# Patient Record
Sex: Male | Born: 1980 | Hispanic: Yes | Marital: Married | Smoking: Never smoker
Health system: Southern US, Community
[De-identification: ages and names within clinical notes are randomized; demographics above are authoritative.]

## PROBLEM LIST (undated history)

## (undated) DIAGNOSIS — S61012S Laceration without foreign body of left thumb without damage to nail, sequela: Secondary | ICD-10-CM

---

## 2013-07-25 ENCOUNTER — Emergency Department (INDEPENDENT_AMBULATORY_CARE_PROVIDER_SITE_OTHER): Payer: BC Managed Care – PPO

## 2013-07-25 ENCOUNTER — Emergency Department (HOSPITAL_COMMUNITY)
Admission: EM | Admit: 2013-07-25 | Discharge: 2013-07-25 | Disposition: A | Discharge status: Home / Self Care | Payer: BC Managed Care – PPO | Source: Home / Self Care | Attending: Family Medicine | Admitting: Family Medicine

## 2013-07-25 ENCOUNTER — Encounter (HOSPITAL_COMMUNITY): Payer: Self-pay | Admitting: Emergency Medicine

## 2013-07-25 DIAGNOSIS — B9789 Other viral agents as the cause of diseases classified elsewhere: Secondary | ICD-10-CM

## 2013-07-25 DIAGNOSIS — B349 Viral infection, unspecified: Secondary | ICD-10-CM

## 2013-07-25 LAB — POCT RAPID STREP A: Streptococcus, Group A Screen (Direct): NEGATIVE

## 2013-07-25 NOTE — Discharge Instructions (Signed)
Rest, drink plenty of fluids, return on tues if further problems.

## 2013-07-25 NOTE — ED Notes (Signed)
Reportedly has been having URI for several days, w congestion, generalized pain, chills

## 2013-07-25 NOTE — ED Provider Notes (Signed)
CSN: [id number]     Arrival date & time 07/25/13  1212 History   First MD Initiated Contact with Patient 07/25/13 1356     Chief Complaint  Patient presents with  . URI   (Consider location/radiation/quality/duration/timing/severity/associated sxs/prior Treatment) Patient is a 33 y.o. male presenting with URI. The history is provided by the patient. The history is limited by a language barrier.  URI Presenting symptoms: congestion, cough, fever and sore throat   Severity:  Moderate Onset quality:  Sudden Duration:  3 days Progression:  Worsening Chronicity:  New Associated symptoms: myalgias   Associated symptoms comment:  No gi or gu sx, no rash or ticks. Risk factors: no recent illness, no recent travel and no sick contacts     History reviewed. No pertinent past medical history. History reviewed. No pertinent past surgical history. History reviewed. No pertinent family history. History  Substance Use Topics  . Smoking status: Never Smoker   . Smokeless tobacco: Not on file  . Alcohol Use: No    Review of Systems  Constitutional: Positive for fever and chills.  HENT: Positive for congestion and sore throat.   Respiratory: Positive for cough.   Gastrointestinal: Negative.   Genitourinary: Negative.   Musculoskeletal: Positive for myalgias.  Skin: Negative for rash.    Allergies  Review of patient's allergies indicates no known allergies.  Home Medications  No current outpatient prescriptions on file. BP 134/72  Pulse 98  Temp(Src) 100.6 F (38.1 C) (Oral)  Resp 16  SpO2 100% Physical Exam  Nursing note and vitals reviewed. Constitutional: He is oriented to person, place, and time. He appears well-developed and well-nourished. No distress.  HENT:  Head: Normocephalic.  Right Ear: External ear normal.  Left Ear: External ear normal.  Mouth/Throat: Oropharynx is clear and moist.  Eyes: Conjunctivae are normal. Pupils are equal, round, and reactive to light.   Neck: Normal range of motion. Neck supple.  Cardiovascular: Regular rhythm, normal heart sounds and intact distal pulses.   Pulmonary/Chest: Effort normal and breath sounds normal.  Abdominal: Soft.  Lymphadenopathy:    He has no cervical adenopathy.  Neurological: He is alert and oriented to person, place, and time.  Skin: Skin is warm and dry.    ED Course  Procedures (including critical care time) Labs Review Labs Reviewed  POCT RAPID STREP A ([facility] CARE ONLY)   Imaging Review Dg Chest 2 View  07/25/2013   CLINICAL DATA:  Cough an fever  EXAM: CHEST  2 VIEW  COMPARISON:  None.  FINDINGS: There is no edema or consolidation. The heart size and pulmonary vascularity are normal. No adenopathy. No bone lesions.  IMPRESSION: No abnormality noted.   Electronically Signed   By: Bretta BangWilliam  Woodruff M.D.   On: 07/25/2013 14:41   Strep neg X-rays reviewed and report per radiologist.   MDM   1. Viral syndrome        Linna HoffJames D Kimbree Casanas, MD 07/25/13 [phone]

## 2013-07-27 LAB — CULTURE, GROUP A STREP

## 2017-11-23 ENCOUNTER — Ambulatory Visit: Payer: Self-pay

## 2017-11-23 ENCOUNTER — Other Ambulatory Visit: Payer: Self-pay | Admitting: Occupational Medicine

## 2017-11-23 DIAGNOSIS — M79644 Pain in right finger(s): Secondary | ICD-10-CM

## 2020-12-01 ENCOUNTER — Encounter (HOSPITAL_COMMUNITY): Payer: Self-pay | Admitting: Emergency Medicine

## 2020-12-01 ENCOUNTER — Emergency Department (HOSPITAL_COMMUNITY): Payer: No Typology Code available for payment source

## 2020-12-01 ENCOUNTER — Emergency Department (HOSPITAL_COMMUNITY)
Admission: EM | Admit: 2020-12-01 | Discharge: 2020-12-01 | Disposition: A | Discharge status: Home / Self Care | Payer: No Typology Code available for payment source | Attending: Emergency Medicine | Admitting: Emergency Medicine

## 2020-12-01 DIAGNOSIS — W298XXA Contact with other powered powered hand tools and household machinery, initial encounter: Secondary | ICD-10-CM | POA: Diagnosis not present

## 2020-12-01 DIAGNOSIS — S61012A Laceration without foreign body of left thumb without damage to nail, initial encounter: Secondary | ICD-10-CM | POA: Diagnosis present

## 2020-12-01 DIAGNOSIS — Z23 Encounter for immunization: Secondary | ICD-10-CM | POA: Diagnosis not present

## 2020-12-01 DIAGNOSIS — Y939 Activity, unspecified: Secondary | ICD-10-CM | POA: Diagnosis not present

## 2020-12-01 MED ORDER — OXYCODONE-ACETAMINOPHEN 5-325 MG PO TABS
1.0000 | ORAL_TABLET | Freq: Once | ORAL | Status: AC
  Administered 2020-12-01: 1 via ORAL
  Filled 2020-12-01: qty 1

## 2020-12-01 MED ORDER — CEPHALEXIN 500 MG PO CAPS: 500.0000 mg | ORAL_CAPSULE | Freq: Two times a day (BID) | ORAL | 0 refills | Status: AC

## 2020-12-01 MED ORDER — OXYCODONE HCL 5 MG PO TABS
5.0000 mg | ORAL_TABLET | Freq: Once | ORAL | Status: AC
Start: 2020-12-01 — End: 2020-12-01
  Administered 2020-12-01: 5 mg via ORAL
  Filled 2020-12-01: qty 1

## 2020-12-01 MED ORDER — ACETAMINOPHEN 325 MG PO TABS
650.0000 mg | ORAL_TABLET | Freq: Once | ORAL | Status: AC
  Administered 2020-12-01: 650 mg via ORAL
  Filled 2020-12-01: qty 2

## 2020-12-01 MED ORDER — OXYCODONE-ACETAMINOPHEN 5-325 MG PO TABS: 1.0000 | ORAL_TABLET | Freq: Three times a day (TID) | ORAL | 0 refills | Status: AC | PRN

## 2020-12-01 MED ORDER — LIDOCAINE HCL (PF) 1 % IJ SOLN
30.0000 mL | Freq: Once | INTRAMUSCULAR | Status: AC
  Administered 2020-12-01: 30 mL
  Filled 2020-12-01: qty 30

## 2020-12-01 MED ORDER — TETANUS-DIPHTH-ACELL PERTUSSIS 5-2.5-18.5 LF-MCG/0.5 IM SUSY
0.5000 mL | PREFILLED_SYRINGE | Freq: Once | INTRAMUSCULAR | Status: AC
  Administered 2020-12-01: 0.5 mL via INTRAMUSCULAR
  Filled 2020-12-01: qty 0.5

## 2020-12-01 MED ORDER — CEFAZOLIN SODIUM-DEXTROSE 2-4 GM/100ML-% IV SOLN
2.0000 g | Freq: Once | INTRAVENOUS | Status: AC
  Administered 2020-12-01: 2 g via INTRAVENOUS
  Filled 2020-12-01: qty 100

## 2020-12-01 MED ORDER — BACITRACIN ZINC 500 UNIT/GM EX OINT
TOPICAL_OINTMENT | Freq: Two times a day (BID) | CUTANEOUS | Status: DC
  Administered 2020-12-01: 1 via TOPICAL
  Filled 2020-12-01: qty 0.9

## 2020-12-01 NOTE — ED Provider Notes (Addendum)
[facility] EMERGENCY DEPARTMENT Provider Note   CSN: [id number] Arrival date & time: 12/01/20  8127     History No chief complaint on file.   Mitchell Young is a 40 y.o. male presenting to the ED with a chief complaint of laceration.  Just prior to arrival was using a drill when he sustained a laceration to his left thumb.  Reports pain with injury.  Denies any numbness or weakness.  Unsure of last tetanus.  Denies any other injuries.  HPI  Medical Spanish interpreter was used for visit.  History reviewed. No pertinent past medical history.  There are no problems to display for this patient.   History reviewed. No pertinent surgical history.     No family history on file.  Social History   Tobacco Use   Smoking status: Never   Smokeless tobacco: Never  Substance Use Topics   Alcohol use: No   Drug use: Not Currently    Home Medications Prior to Admission medications   Medication Sig Start Date End Date Taking? Authorizing Provider  cephALEXin (KEFLEX) 500 MG capsule Take 1 capsule (500 mg total) by mouth 2 (two) times daily for 7 days. 12/01/20 12/08/20 Yes Talajah Slimp, PA-C  oxyCODONE-acetaminophen (PERCOCET/ROXICET) 5-325 MG tablet Take 1 tablet by mouth every 8 (eight) hours as needed for severe pain. 12/01/20  Yes Brittany Osier, PA-C    Allergies    Patient has no known allergies.  Review of Systems   Review of Systems  Constitutional:  Negative for appetite change, chills and fever.  HENT:  Negative for ear pain, rhinorrhea, sneezing and sore throat.   Eyes:  Negative for photophobia and visual disturbance.  Respiratory:  Negative for cough, chest tightness, shortness of breath and wheezing.   Cardiovascular:  Negative for chest pain and palpitations.  Gastrointestinal:  Negative for abdominal pain, blood in stool, constipation, diarrhea, nausea and vomiting.  Genitourinary:  Negative for dysuria, hematuria and urgency.   Musculoskeletal:  Negative for myalgias.  Skin:  Positive for wound. Negative for rash.  Neurological:  Negative for dizziness, weakness and light-headedness.   Physical Exam Updated Vital Signs BP 125/79   Pulse (!) 50   Temp 98.3 F (36.8 C) (Oral)   Resp 19   SpO2 100%   Physical Exam Vitals and nursing note reviewed.  Constitutional:      General: He is not in acute distress.    Appearance: He is well-developed.  HENT:     Head: Normocephalic and atraumatic.     Nose: Nose normal.  Eyes:     General: No scleral icterus.       Left eye: No discharge.     Conjunctiva/sclera: Conjunctivae normal.  Cardiovascular:     Rate and Rhythm: Normal rate and regular rhythm.     Heart sounds: Normal heart sounds. No murmur heard.   No friction rub. No gallop.  Pulmonary:     Effort: Pulmonary effort is normal. No respiratory distress.     Breath sounds: Normal breath sounds.  Abdominal:     General: Bowel sounds are normal. There is no distension.     Palpations: Abdomen is soft.     Tenderness: There is no abdominal tenderness. There is no guarding.  Musculoskeletal:        General: Normal range of motion.     Cervical back: Normal range of motion and neck supple.  Skin:    General: Skin is warm and dry.  Findings: No rash.     Comments: 5 cm laceration noted to left thumb in the image.  Normal sensation to light touch.  2+ radial pulse palpated.  Normal range of motion of the DIP joint.  Limited extension at the PIP joint.  Possible visible tendon on exam.  Neurological:     Mental Status: He is alert.     Motor: No abnormal muscle tone.     Coordination: Coordination normal.      ED Results / Procedures / Treatments   Labs (all labs ordered are listed, but only abnormal results are displayed) Labs Reviewed - No data to display  EKG None  Radiology DG Hand Complete Left  Result Date: 12/01/2020 CLINICAL DATA:  40 year old male status post laceration from  power drill. EXAM: LEFT HAND - COMPLETE 3+ VIEW COMPARISON:  None. FINDINGS: There are numerous dystrophic calcifications about the left hand, some appear to be at the skin surface. Soft tissue wound overlying the left thumb MCP joint. No underlying fracture identified. Joint spaces and alignment maintained. Other visible osseous structures appear intact. IMPRESSION: 1. Soft tissue wound at the left thumb metacarpal with no underlying osseous abnormality identified. 2. Numerous dystrophic calcifications about the left hand, some appear to be on the skin surface. Electronically Signed   By: Odessa Fleming M.D.   On: 12/01/2020 11:21    Procedures .Marland KitchenLaceration Repair  Date/Time: 12/01/2020 2:26 PM Performed by: Dietrich Pates, PA-C Authorized by: Dietrich Pates, PA-C   Consent:    Consent obtained:  Verbal   Consent given by:  Patient   Risks, benefits, and alternatives were discussed: yes     Risks discussed:  Infection, need for additional repair, pain, poor cosmetic result, poor wound healing, tendon damage, vascular damage, retained foreign body and nerve damage Universal protocol:    Patient identity confirmed:  Verbally with patient Anesthesia:    Anesthesia method:  Local infiltration   Local anesthetic:  Lidocaine 1% w/o epi Laceration details:    Location:  Finger   Finger location:  L thumb   Length (cm):  5 Exploration:    Hemostasis achieved with:  Direct pressure   Wound exploration: wound explored through full range of motion and entire depth of wound visualized     Wound extent: no underlying fracture noted     Contaminated: yes   Treatment:    Area cleansed with:  Saline   Amount of cleaning:  Extensive   Irrigation solution:  Sterile saline   Irrigation volume:  2L   Irrigation method:  Pressure wash, syringe and tap Skin repair:    Repair method:  Sutures   Suture size:  4-0   Wound skin closure material used: VICRYL RAPIDE.   Suture technique:  Simple interrupted   Number  of sutures:  14 Repair type:    Repair type:  Simple Post-procedure details:    Dressing:  Open (no dressing)   Procedure completion:  Tolerated well, no immediate complications   Medications Ordered in ED Medications  oxyCODONE (Oxy IR/ROXICODONE) immediate release tablet 5 mg (has no administration in time range)  bacitracin ointment (has no administration in time range)  lidocaine (PF) (XYLOCAINE) 1 % injection 30 mL (30 mLs Infiltration Given 12/01/20 1237)  Tdap (BOOSTRIX) injection 0.5 mL (0.5 mLs Intramuscular Given 12/01/20 1237)  oxyCODONE-acetaminophen (PERCOCET/ROXICET) 5-325 MG per tablet 1 tablet (1 tablet Oral Given 12/01/20 1236)  acetaminophen (TYLENOL) tablet 650 mg (650 mg Oral Given 12/01/20 1236)  ceFAZolin (ANCEF)  IVPB 2g/100 mL premix (0 g Intravenous Stopped 12/01/20 1329)    ED Course  I have reviewed the triage vital signs and the nursing notes.  Pertinent labs & imaging results that were available during my care of the patient were reviewed by me and considered in my medical decision making (see chart for details).    MDM Rules/Calculators/A&P                           40 year old male presenting to the ED for a left thumb laceration that occurred with a power drill just prior to arrival.  There is about a 5 cm wound on the dorsal aspect of the left thumb crossing the joint.  X-rays negative for fracture.  Pressure irrigation performed extensively as this was visibly contaminated.  There is a possibility of partial tendon injury as he is unable to fully extend the thumb at the base.  I spoke to Dr. Janee Morn, on-call for hand surgery who recommends repair at the bedside, antibiotics and the clinic will contact him regarding follow-up on the next business day.  Wound explored and base of wound visualized in a bloodless field without evidence of foreign body.  Laceration occurred < 8 hours prior to repair which was well tolerated.Tdap updated.  Pt has  no comorbidities  to effect normal wound healing. Pt discharged  with Keflex after being given a dose of Ancef here in the ER. Patient counseled on wound care. Patient was urged to return to the Emergency Department urgently with worsening pain, swelling, expanding erythema especially if it streaks away from the affected area, fever, or if they have any other concerns. Patient verbalized understanding.   All imaging, if done today, including plain films, CT scans, and ultrasounds, independently reviewed by me, and interpretations confirmed via formal radiology reads.  Patient is hemodynamically stable, in NAD, and able to ambulate in the ED. Evaluation does not show pathology that would require ongoing emergent intervention or inpatient treatment. I explained the diagnosis to the patient. Pain has been managed and has no complaints prior to discharge. Patient is comfortable with above plan and is stable for discharge at this time. All questions were answered prior to disposition. Strict return precautions for returning to the ED were discussed. Encouraged follow up with PCP.   Prior to providing a prescription for a controlled substance, I independently reviewed the patient's recent prescription history on the [place] Controlled Substance Reporting System. The patient had no recent or regular prescriptions and was deemed appropriate for a brief, less than 3 day prescription of narcotic for acute analgesia.  An After Visit Summary was printed and given to the patient.   Portions of this note were generated with Scientist, clinical (histocompatibility and immunogenetics). Dictation errors may occur despite best attempts at proofreading.  Final Clinical Impression(s) / ED Diagnoses Final diagnoses:  Laceration of left thumb without damage to nail, foreign body presence unspecified, initial encounter    Rx / DC Orders ED Discharge Orders          Ordered    oxyCODONE-acetaminophen (PERCOCET/ROXICET) 5-325 MG tablet  Every 8 hours PRN         12/01/20 1441    cephALEXin (KEFLEX) 500 MG capsule  2 times daily        12/01/20 1441             Dietrich Pates, PA-C 12/01/20 1442    Dietrich Pates, PA-C 12/01/20 1455  Gloris Manchesterixon, Ryan, MD 12/01/20 [phone]

## 2020-12-01 NOTE — ED Notes (Signed)
Wound care done, provider at bedside.

## 2020-12-01 NOTE — ED Triage Notes (Addendum)
Large laceration to L posterior thumb from a drill just PTA.  Hand covered in dirt.  Wound cleaned on arrival and bandage placed.  Unknown DT.  Sensation intact.

## 2020-12-01 NOTE — Discharge Instructions (Addendum)
The sutures that I have placed will absorb into your skin when they are ready. Dr. Janee Morn, the hand orthopedic specialist, will contact you regarding an appointment on Monday. Take the antibiotic as prescribed and take the pain medicine as needed. Return to the ER for any signs of infection including redness, increased swelling or pain, drainage or fever. Wear the brace to help with immobilization.   Las suturas que he colocado se absorbern en su piel cuando estn listas. El Dr. Janee Morn, el especialista en ortopedia de la mano, se comunicar con usted para programar una cita el lunes. Tome el antibitico segn lo recetado y tome el medicamento para el dolor segn sea necesario. Regrese a la sala de emergencias por cualquier signo de infeccin, incluido enrojecimiento, aumento de la hinchazn o el dolor, drenaje o [place]. Use el aparato ortopdico para ayudar con la inmovilizacin.

## 2020-12-03 ENCOUNTER — Other Ambulatory Visit: Payer: Self-pay | Admitting: Orthopedic Surgery

## 2020-12-03 ENCOUNTER — Other Ambulatory Visit: Payer: Self-pay

## 2020-12-03 ENCOUNTER — Encounter (HOSPITAL_BASED_OUTPATIENT_CLINIC_OR_DEPARTMENT_OTHER): Payer: Self-pay | Admitting: Orthopedic Surgery

## 2020-12-05 ENCOUNTER — Ambulatory Visit (HOSPITAL_BASED_OUTPATIENT_CLINIC_OR_DEPARTMENT_OTHER): Admission: RE | Admit: 2020-12-05 | Payer: Self-pay | Source: Home / Self Care | Admitting: Orthopedic Surgery

## 2020-12-05 HISTORY — DX: Laceration without foreign body of left thumb without damage to nail, sequela: S61.012S

## 2020-12-05 SURGERY — REPAIR, TENDON, EXTENSOR
Anesthesia: Monitor Anesthesia Care | Laterality: Left

## 2022-04-27 IMAGING — DX DG HAND COMPLETE 3+V*L*
4 series · 4 of 4 positions shown · non-contrast
Comparison: None.

CLINICAL DATA: 40-year-old male status post laceration from power
drill.

EXAM:
LEFT HAND - COMPLETE 3+ VIEW

[hand pa]
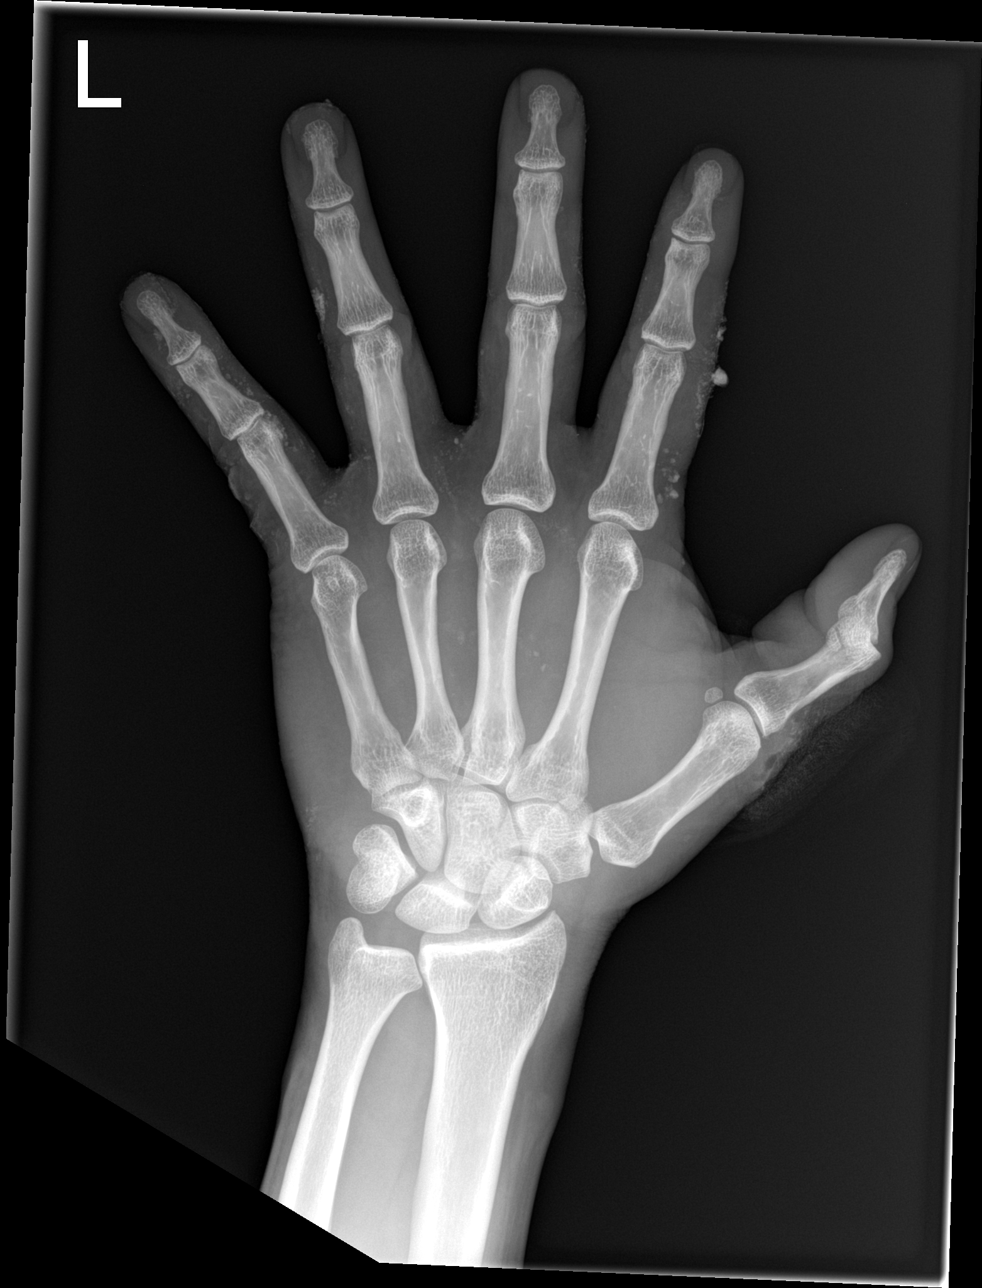

[hand obl]
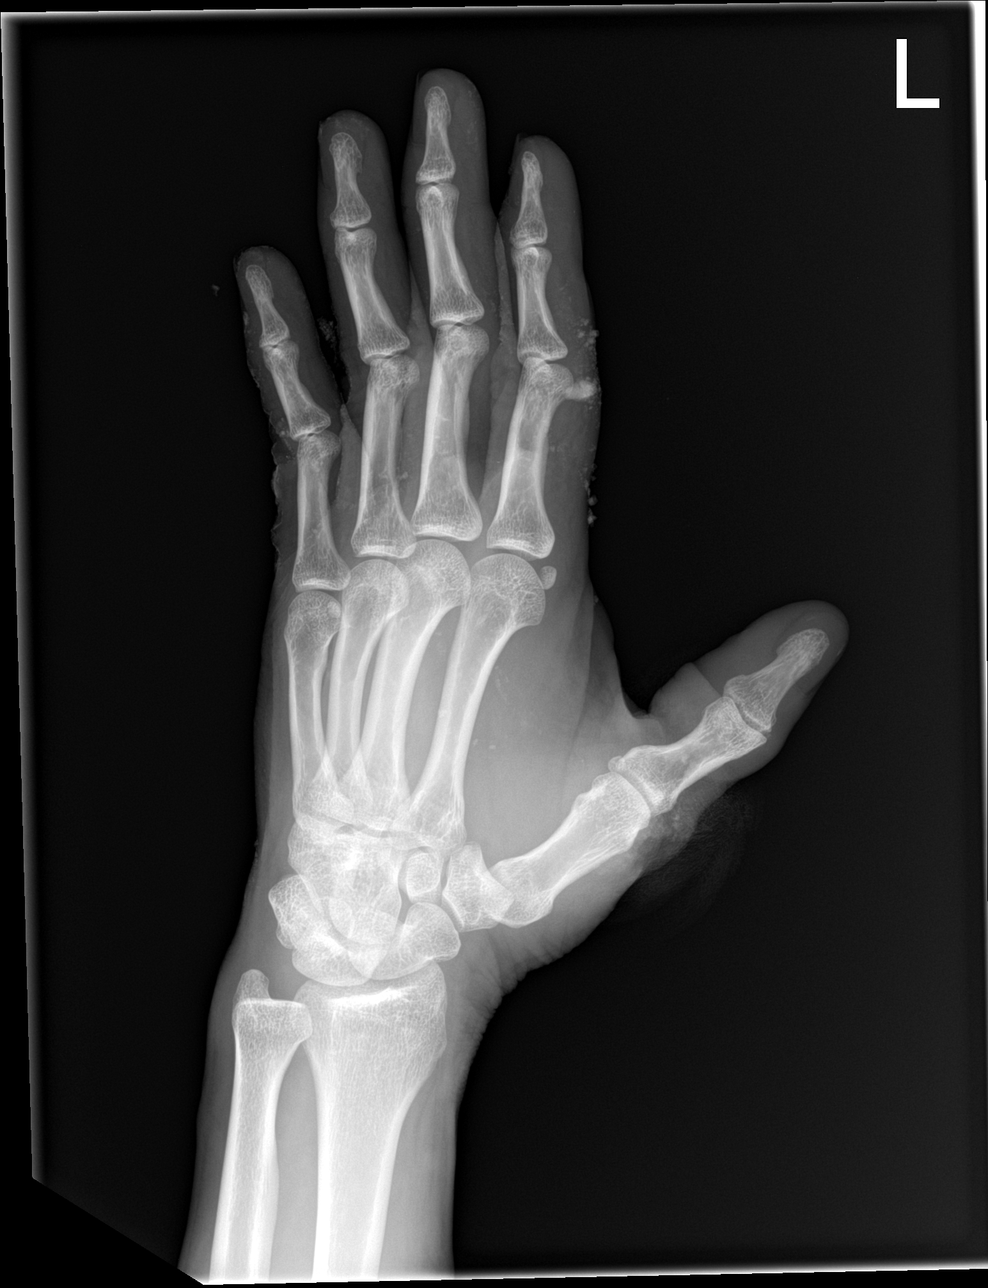

[hand lat (1 of 2)]
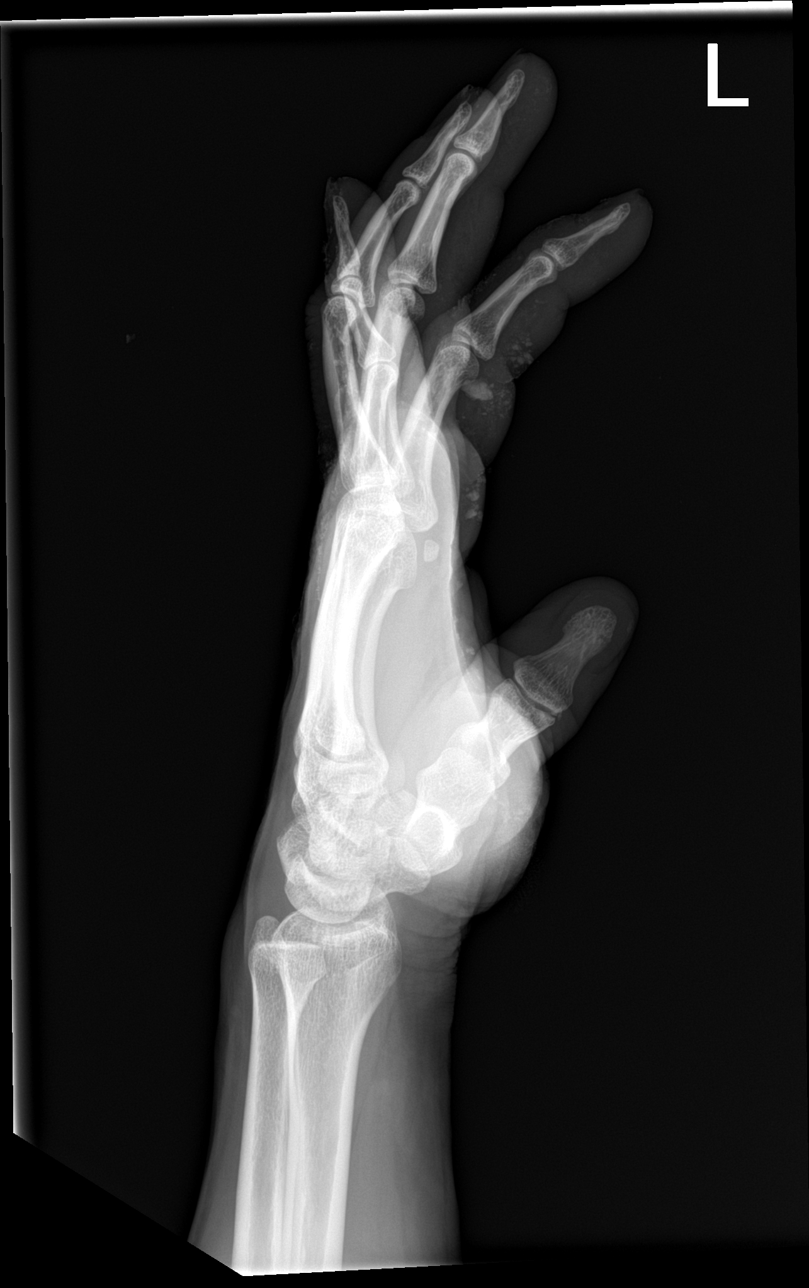

[hand lat (2 of 2)]
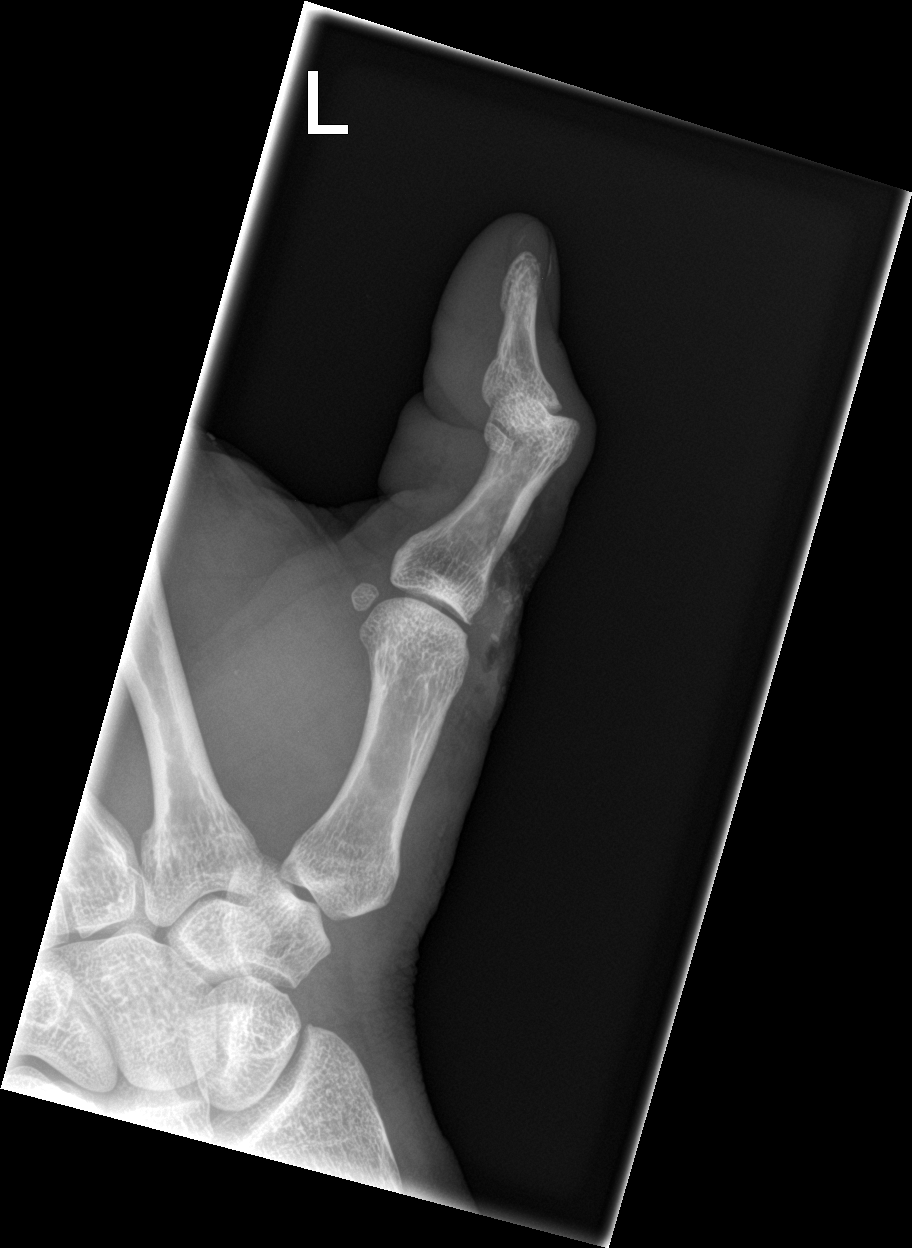

[4 of 4 positions shown; findings below may reference images not displayed]

FINDINGS: There are numerous dystrophic calcifications about the left hand,
some appear to be at the skin surface.

Soft tissue wound overlying the left thumb MCP joint. No underlying
fracture identified. Joint spaces and alignment maintained. Other
visible osseous structures appear intact.
IMPRESSION: 1. Soft tissue wound at the left thumb metacarpal with no underlying
osseous abnormality identified.
2. Numerous dystrophic calcifications about the left hand, some
appear to be on the skin surface.
